# Patient Record
Sex: Female | Born: 1975 | Race: White | Hispanic: No | Marital: Married | Smoking: Current every day smoker
Health system: Southern US, Community
[De-identification: ages and names within clinical notes are randomized; demographics above are authoritative.]

## PROBLEM LIST (undated history)

## (undated) HISTORY — PX: ABDOMINAL HYSTERECTOMY: SHX81

---

## 1999-05-14 ENCOUNTER — Other Ambulatory Visit: Admission: RE | Admit: 1999-05-14 | Discharge: 1999-05-14 | Payer: Self-pay | Admitting: Obstetrics and Gynecology

## 1999-08-27 ENCOUNTER — Encounter: Admission: RE | Admit: 1999-08-27 | Discharge: 1999-11-25 | Payer: Self-pay | Admitting: Obstetrics and Gynecology

## 1999-10-17 ENCOUNTER — Encounter (HOSPITAL_COMMUNITY): Admission: AD | Admit: 1999-10-17 | Discharge: 1999-11-28 | Payer: Self-pay | Admitting: Obstetrics and Gynecology

## 1999-11-26 ENCOUNTER — Inpatient Hospital Stay (HOSPITAL_COMMUNITY): Admission: AD | Admit: 1999-11-26 | Discharge: 1999-11-26 | Payer: Self-pay | Admitting: Obstetrics and Gynecology

## 1999-11-27 ENCOUNTER — Encounter (INDEPENDENT_AMBULATORY_CARE_PROVIDER_SITE_OTHER): Payer: Self-pay

## 1999-11-27 ENCOUNTER — Inpatient Hospital Stay (HOSPITAL_COMMUNITY): Admission: AD | Admit: 1999-11-27 | Discharge: 1999-11-30 | Payer: Self-pay | Admitting: Obstetrics and Gynecology

## 2000-01-25 ENCOUNTER — Other Ambulatory Visit: Admission: RE | Admit: 2000-01-25 | Discharge: 2000-01-25 | Payer: Self-pay | Admitting: Obstetrics and Gynecology

## 2001-03-23 ENCOUNTER — Emergency Department (HOSPITAL_COMMUNITY): Admission: EM | Admit: 2001-03-23 | Discharge: 2001-03-23 | Payer: Self-pay | Admitting: Emergency Medicine

## 2002-08-27 ENCOUNTER — Encounter: Admission: RE | Admit: 2002-08-27 | Discharge: 2002-08-27 | Payer: Self-pay | Admitting: Obstetrics and Gynecology

## 2002-08-27 ENCOUNTER — Encounter: Payer: Self-pay | Admitting: Obstetrics and Gynecology

## 2002-10-14 ENCOUNTER — Observation Stay (HOSPITAL_COMMUNITY): Admission: RE | Admit: 2002-10-14 | Discharge: 2002-10-15 | Payer: Self-pay | Admitting: Surgery

## 2002-10-14 ENCOUNTER — Encounter (INDEPENDENT_AMBULATORY_CARE_PROVIDER_SITE_OTHER): Payer: Self-pay | Admitting: Specialist

## 2004-01-17 ENCOUNTER — Ambulatory Visit (HOSPITAL_COMMUNITY): Admission: RE | Admit: 2004-01-17 | Discharge: 2004-01-17 | Payer: Self-pay | Admitting: Obstetrics and Gynecology

## 2004-04-02 ENCOUNTER — Emergency Department (HOSPITAL_COMMUNITY): Admission: EM | Admit: 2004-04-02 | Discharge: 2004-04-02 | Payer: Self-pay | Admitting: Emergency Medicine

## 2004-08-16 ENCOUNTER — Observation Stay (HOSPITAL_COMMUNITY): Admission: AD | Admit: 2004-08-16 | Discharge: 2004-08-17 | Payer: Self-pay | Admitting: *Deleted

## 2004-08-30 ENCOUNTER — Inpatient Hospital Stay (HOSPITAL_COMMUNITY): Admission: AD | Admit: 2004-08-30 | Discharge: 2004-08-31 | Payer: Self-pay | Admitting: Obstetrics & Gynecology

## 2004-09-01 ENCOUNTER — Observation Stay (HOSPITAL_COMMUNITY): Admission: AD | Admit: 2004-09-01 | Discharge: 2004-09-02 | Payer: Self-pay | Admitting: *Deleted

## 2004-09-02 ENCOUNTER — Encounter (INDEPENDENT_AMBULATORY_CARE_PROVIDER_SITE_OTHER): Payer: Self-pay | Admitting: *Deleted

## 2004-09-02 ENCOUNTER — Inpatient Hospital Stay (HOSPITAL_COMMUNITY): Admission: AD | Admit: 2004-09-02 | Discharge: 2004-09-05 | Payer: Self-pay | Admitting: *Deleted

## 2005-04-11 ENCOUNTER — Inpatient Hospital Stay (HOSPITAL_COMMUNITY): Admission: AD | Admit: 2005-04-11 | Discharge: 2005-04-11 | Payer: Self-pay | Admitting: Obstetrics and Gynecology

## 2005-08-21 ENCOUNTER — Inpatient Hospital Stay (HOSPITAL_COMMUNITY): Admission: AD | Admit: 2005-08-21 | Discharge: 2005-08-21 | Payer: Self-pay | Admitting: Obstetrics and Gynecology

## 2005-09-18 ENCOUNTER — Inpatient Hospital Stay (HOSPITAL_COMMUNITY): Admission: AD | Admit: 2005-09-18 | Discharge: 2005-09-18 | Payer: Self-pay | Admitting: *Deleted

## 2005-10-01 ENCOUNTER — Ambulatory Visit (HOSPITAL_COMMUNITY): Admission: RE | Admit: 2005-10-01 | Discharge: 2005-10-01 | Payer: Self-pay | Admitting: Obstetrics and Gynecology

## 2005-10-02 ENCOUNTER — Ambulatory Visit: Payer: Self-pay | Admitting: Gynecology

## 2005-10-02 ENCOUNTER — Inpatient Hospital Stay (HOSPITAL_COMMUNITY): Admission: AD | Admit: 2005-10-02 | Discharge: 2005-10-02 | Payer: Self-pay | Admitting: Obstetrics and Gynecology

## 2005-10-09 ENCOUNTER — Ambulatory Visit: Payer: Self-pay | Admitting: Gynecology

## 2005-10-14 ENCOUNTER — Ambulatory Visit: Payer: Self-pay | Admitting: *Deleted

## 2005-10-14 ENCOUNTER — Inpatient Hospital Stay (HOSPITAL_COMMUNITY): Admission: AD | Admit: 2005-10-14 | Discharge: 2005-10-15 | Payer: Self-pay | Admitting: *Deleted

## 2005-10-14 ENCOUNTER — Inpatient Hospital Stay (HOSPITAL_COMMUNITY): Admission: AD | Admit: 2005-10-14 | Discharge: 2005-10-14 | Payer: Self-pay | Admitting: *Deleted

## 2005-10-16 ENCOUNTER — Inpatient Hospital Stay (HOSPITAL_COMMUNITY): Admission: AD | Admit: 2005-10-16 | Discharge: 2005-10-16 | Payer: Self-pay | Admitting: Obstetrics and Gynecology

## 2005-10-29 ENCOUNTER — Ambulatory Visit: Payer: Self-pay | Admitting: Obstetrics and Gynecology

## 2005-11-07 ENCOUNTER — Encounter (INDEPENDENT_AMBULATORY_CARE_PROVIDER_SITE_OTHER): Payer: Self-pay | Admitting: *Deleted

## 2005-11-07 ENCOUNTER — Inpatient Hospital Stay (HOSPITAL_COMMUNITY): Admission: AD | Admit: 2005-11-07 | Discharge: 2005-11-10 | Payer: Self-pay | Admitting: Obstetrics and Gynecology

## 2008-01-05 ENCOUNTER — Ambulatory Visit (HOSPITAL_COMMUNITY): Admission: RE | Admit: 2008-01-05 | Discharge: 2008-01-05 | Payer: Self-pay | Admitting: Obstetrics and Gynecology

## 2009-02-21 ENCOUNTER — Ambulatory Visit: Payer: Self-pay | Admitting: Family Medicine

## 2009-02-21 DIAGNOSIS — F3289 Other specified depressive episodes: Secondary | ICD-10-CM | POA: Insufficient documentation

## 2009-02-21 DIAGNOSIS — F329 Major depressive disorder, single episode, unspecified: Secondary | ICD-10-CM | POA: Insufficient documentation

## 2009-02-21 DIAGNOSIS — M7750 Other enthesopathy of unspecified foot: Secondary | ICD-10-CM | POA: Insufficient documentation

## 2009-12-06 ENCOUNTER — Encounter (INDEPENDENT_AMBULATORY_CARE_PROVIDER_SITE_OTHER): Payer: Self-pay | Admitting: Obstetrics and Gynecology

## 2009-12-06 ENCOUNTER — Ambulatory Visit (HOSPITAL_COMMUNITY)
Admission: RE | Admit: 2009-12-06 | Discharge: 2009-12-07 | Payer: Self-pay | Source: Home / Self Care | Admitting: Obstetrics and Gynecology

## 2010-09-03 LAB — CBC
HCT: 36.6 % (ref 36.0–46.0)
HCT: 44.5 % (ref 36.0–46.0)
Hemoglobin: 12.6 g/dL (ref 12.0–15.0)
Hemoglobin: 14.4 g/dL (ref 12.0–15.0)
MCH: 31.6 pg (ref 26.0–34.0)
MCHC: 32.3 g/dL (ref 30.0–36.0)
MCHC: 34.4 g/dL (ref 30.0–36.0)
MCV: 91.8 fL (ref 78.0–100.0)
MCV: 92.6 fL (ref 78.0–100.0)
Platelets: 231 10*3/uL (ref 150–400)
Platelets: 248 10*3/uL (ref 150–400)
RBC: 3.98 MIL/uL (ref 3.87–5.11)
RBC: 4.81 MIL/uL (ref 3.87–5.11)
RDW: 12 % (ref 11.5–15.5)
RDW: 12.3 % (ref 11.5–15.5)
WBC: 12.2 10*3/uL — ABNORMAL HIGH (ref 4.0–10.5)
WBC: 5.6 10*3/uL (ref 4.0–10.5)

## 2010-09-03 LAB — BASIC METABOLIC PANEL
BUN: 4 mg/dL — ABNORMAL LOW (ref 6–23)
CO2: 27 mEq/L (ref 19–32)
Calcium: 8.7 mg/dL (ref 8.4–10.5)
Chloride: 106 mEq/L (ref 96–112)
Creatinine, Ser: 0.46 mg/dL (ref 0.4–1.2)
GFR calc Af Amer: >60 mL/min (ref >60)
GFR calc non Af Amer: >60 mL/min (ref >60)
Glucose, Bld: 127 mg/dL — ABNORMAL HIGH (ref 70–99)
Potassium: 4 mEq/L (ref 3.5–5.1)
Sodium: 136 mEq/L (ref 135–145)

## 2010-09-03 LAB — SURGICAL PCR SCREEN
MRSA, PCR: NEGATIVE
Staphylococcus aureus: NEGATIVE

## 2010-10-30 NOTE — Op Note (Signed)
NAMEALIZ, MERITT NO.:  [id number]   MEDICAL RECORD NO.:  [id number]          PATIENT TYPE:  AMB   LOCATION:  SDC                           [facility]  [facility]   PHYSICIAN:  Maxie Better, M.D.DATE OF BIRTH:  1975-12-08   DATE OF PROCEDURE:  01/05/2008  DATE OF DISCHARGE:                               OPERATIVE REPORT   PREOPERATIVE DIAGNOSIS:  Menorrhagia.   POSTOPERATIVE DIAGNOSIS:  Menorrhagia.   PROCEDURE:  Diagnostic hysteroscopy with hydrothermal ablation of the  endometrium.   ANESTHESIA:  General paracervical block.   SURGEON:  Maxie Better, MD.   PROCEDURE:  Under adequate general anesthesia, the patient was placed in  the dorsal lithotomy. position.  Examination under anesthesia revealed  an anteverted normal-size uterus, no adnexal masses could be  appreciated.  The patient was sterilely prepped and draped in usual  fashion.  Bladder was catheterized for moderate amount of urine.  Bivalve speculum was placed in the vagina.  A 10 mL of 1% Nesacaine was  injected paracervically at 3 o'clock and 9 o'clock positions.  The  anterior lip of the cervix was grasped with a single-tooth tenaculum.  Cervix was inferiorly dilated up to #21 Pratt dilator and the  hydrothermal ablation apparatus was attempted to be introduced into the  uterine cavity. It was not able to traverse the external os and  therefore the cervix was dilated up to #23 [facility] dilator, which time the  hydrothermal ablation apparatus was inserted.  Inspection of the uterus  was noted for both tubal ostia could be seen.  There was a slight  arcuate presence of the fundus with some small polypoid lesion noted in  the uterine segment.  After ascertaining and positioning the apparatus  above where the polypoid lesions noted, the procedure was then performed  in this fashion as outlined by the manufacturer of the instrument with  no evidence of thermal reflux of the fluid at all.  When  the procedure  was felt to have been completed, all instruments were then removed from  the vagina.  Specimen was none.  Estimated blood loss was minimal.  Complication was none.  The patient tolerated the procedure well and was  transferred to the recovery room in stable condition.       Maxie Better, M.D.  Electronically Signed     [place]/MEDQ  D:  01/05/2008  T:  01/06/2008  Job:  [id number]

## 2010-11-02 NOTE — Op Note (Signed)
[name], [name] NO.:  [id number]   MEDICAL RECORD NO.:  [id number]          PATIENT TYPE:  INP   LOCATION:                                [facility]   PHYSICIAN:  Maxie Better, M.D.DATE OF BIRTH:  11-Oct-1975   DATE OF PROCEDURE:  11/07/2005  DATE OF DISCHARGE:                                 OPERATIVE REPORT   PREOPERATIVE DIAGNOSIS:  Previous cesarean section x2, twin gestation at 4  weeks.  Desires sterilization.   PROCEDURE:  Repeat cesarean section, [name] hysterotomy and modified Pomeroy  tubal ligation.   POSTOPERATIVE DIAGNOSIS:  Previous cesarean section x2; vertex /breech twin  gestation at 40 weeks [place]  Desires sterilization.   ANESTHESIA:  Spinal.   SURGEON:  Maxie Better, M.D.   ASSISTANTRichardean Sale, M.D.   PROCEDURE:  Under adequate spinal anesthesia, the patient was placed in the  supine position with a left lateral tilt.  She was sterilely prepped and  draped in the usual fashion.  An indwelling Foley catheter was sterilely  placed and 1% Marcaine was injected along the previous Pfannenstiel skin  incision.  Pfannenstiel skin incision was then made and carried down to the  rectus fascia.  Rectus fascia was opened transversely.  The rectus fascia  was carefully divided with sharp and blunt dissection inferiorly,  superiorly.  In trying to do the dissection the parietoperitoneum was  inadvertently opened.  This allowed for better exposure and ability to carry  the dissection of the rectus fascia off the rectus muscle superiorly; and  this was done.  The parietoperitoneum was opened further, but on entering  the abdominal cavity the bladder was adhered to the lower uterine segment.  This was carefully dissected off the lower uterine segment.  The uterine  segment and then shown to be very thin.  The transverse uterine incision was  then made in the lower uterine segment and extended bilaterally.  Artificial  rupture of membranes was done.  Clear copious amniotic fluid was noted.  Subsequent delivery of a live female from vertex position, maternal right,  was accomplished.  The baby was bulb suctioned, the abdomen cord was clamped  and cut.  The baby was transferred to the awaiting pediatrician.   The second twin was in a breech presentation, with the amniotic bag still  intact.  The feet were grasped.  Artificial rupture was then performed, and  the baby was delivered from a breech position with the usual maneuvers.  It  was a live female.  The baby was bulb suctioned, the abdomen cord was clamped  and cut.  The baby was transferred to the awaiting pediatrician.   Apgars of Twin A was 8 and 9; Twin B was 8 and 9.  Weight the baby A was 5  pounds 10 ounces.  Baby B was 6 pounds 5 ounces.  The placenta was manually  removed x2.  The uterine cavity was cleaned of debris.  The uterine incision  was then closed with 0 Monocryl running locked stitch,one layer closure with  care not to involve  the bladder.  There was a bleeding vessel on the  bladder, which was clamped, free tied and suture ligated for good  hemostasis.   Attention was then turned to the tubes bilaterally.  They were identified  down to their fimbriated ends.  Both ovaries were normal.  The mid portion  of both fallopian tubes were grasped with a Babcock.  The underlying  mesosalpinx was then opened.  Then 0 chromic suture x2 proximally and  distally used on both sides of the Babcock (on each tube) was then  performed.  The intervening segment of fallopian tubes on both sides were  then removed.   The abdomen was copiously irrigated, suctioned and good hemostasis then  noted.  The parietoperitoneum was not closed.  The rectus fascia was then  closed with 0 Vicryl x2.  The subcutaneous area was irrigated and small  bleeders cauterized.  Interrupted 2-0 plain suture was then placed.  The  skin was approximated using Ethibond  staples.   SPECIMEN:  Placentax 2, portion of the right and left fallopian tube were  sent to Pathology   ESTIMATED BLOOD LOSS:  750 cc.   INTRAOPERATIVE FLUID:  3200 cc crystalloid.   URINE OUTPUT:  150 cc yellow urine.   COUNTS:  Sponge and instrument counts x2 were correct.   COMPLICATION:  None.   DISPOSITION:  The patient tolerated the procedure well and was transferred  to recovery room in stable condition.      Maxie Better, M.D.  Electronically Signed     [place]/MEDQ  D:  11/07/2005  T:  11/07/2005  Job:  [id number]

## 2010-11-02 NOTE — Op Note (Signed)
NAMEJA, PISTOLE NO.:  [id number]   MEDICAL RECORD NO.:  [id number]          PATIENT TYPE:  INP   LOCATION:  9107                          [facility]  [facility]   PHYSICIAN:  [place] B. Earlene Plater, M.D.  DATE OF BIRTH:  1976-01-25   DATE OF PROCEDURE:  09/02/2004  DATE OF DISCHARGE:                                 OPERATIVE REPORT   PREOPERATIVE DIAGNOSIS:  Term intrauterine pregnancy.  Previous cesarean  section.  Repetitive severe variable decelerations.   POSTOPERATIVE DIAGNOSIS:  Term intrauterine pregnancy.  Previous cesarean  section.  Repetitive severe variable decelerations.   PROCEDURE:  Stat repeat low transverse cesarean section.   SURGEON:  Chester Holstein. Earlene Plater, M.D.   ANESTHESIA:  Spinal.   FINDINGS:  Viable female, 8 and 9 Apgars, arterial pH 7.35, no nuchal cord,  no uterine scar dehiscence.   SPECIMENS:  Placenta to pathology.   ESTIMATED BLOOD LOSS:  750 mL.   COMPLICATIONS:  None.   INDICATIONS FOR PROCEDURE:  The patient presented to labor and delivery in  active labor after having been discharged to home last night not in labor.  She stated she was actively laboring at home for several hours prior to her  return to the hospital as she did not want to be sent back home again.  Once  she was placed on the monitor, she was found to have fetal heart rate in the  120's with severe variable decelerations.  Amniotomy and scalp electrode  were placed and she immediately progressed to 9.  There was good recovery  after the decelerations and good short term variability noted.  The cervix  remained an anterior lip for several contractions and could not be reduced.  In addition, the deceleration pattern persisted.  I therefore recommended a  repeat cesarean section given the possibility for uterine scar dehiscence  given her previous cesarean section.   DESCRIPTION OF PROCEDURE:  The patient was taken to the operating room and  fetal heart rate noted to  be steady in the 130's.  Therefore, the patient  was deemed a good candidate for spinal.  This went very quickly and the  patient was prepped and draped in the usual sterile fashion.  Incision made  through the previous scar and carried sharply to the fascia.  The fascia was  dissected sharply.  The underlying rectus muscles were dissected off  sharply.   The posterior sheath and peritoneum were entered sharply and extended  inferiorly sharply.  Bladder blade inserted. Bladder flap created sharply.  Uterine incision made in a low transverse fashion with the knife.  Of note,  the scar was intact.  Meconium stained fluid was noted at amniotomy.  The  infant's head was delivered through the incision without difficulty and the  nose and mouth suctioned with the DeLee and the remainder of the infant  delivered without difficulty.  There was no nuchal cord or any other obvious  explanation for the decelerations.  The infant was immediately vigorous and  handed off to the awaiting pediatricians.  1 gram of Ancef was given  at cord  clamp.  The placenta was removed manually and the uterus exteriorized and  cleared of all clots and debris.  It was free of extension.  The uterine  incision was then closed in a running locking stitch of 0 Monocryl.  A  second imbricating layer placed with the same stitch.  Hemostasis was  obtained.   The uterus was returned to the abdomen and was irrigated.  Hemostasis noted  at the uterine incision, bladder flap, and subfascial space.  The fascia was  closed with a running stitch of 0 Vicryl.  The subcutaneous tissue was  irrigated and made hemostatic with the Bovie.  Skin was closed with staples.   The patient tolerated the procedure well and there were no complications.  She was taken to the recovery room awake, alert, and in stable condition.  All needle, sponge, and instrument counts correct per the operating room  staff.      WBD/MEDQ  D:  09/02/2004  T:   09/03/2004  Job:  [id number]

## 2010-11-02 NOTE — H&P (Signed)
[facility]  Patient:    SRESHTA, CRESSLER                          MRN: [id number] Adm. Date:  [id number] Disc. Date: [id number] Attending:  Maxie Better                         History and Physical  CHIEF COMPLAINT:              Active labor.  HISTORY OF PRESENT ILLNESS:   A 35 year old white female, gravida 1, para 0, EDD December 13, 1999, at 37+ weeks in active labor.  PAST MEDICAL HISTORY:         Remarkable for no pregnancies.  ALLERGIES:                    No known drug allergies.  MEDICATIONS:                  Prenatal vitamins.  No medical or surgical hospitalizations.  FAMILY HISTORY:               Hypertension, strokes, and brain cancer.  SOCIAL HISTORY:               She is a nonsmoker, nondrinker, and denies domestic or physical violence.  Pregnancy is consistent with dichorionic, diamniotic gestation without complications except for some interval oligohydramnios which as resolved.  PHYSICAL EXAMINATION:  GENERAL:                      She is a well-developed, well-nourished white female in no acute distress.  HEENT:                        Normal.  LUNGS:                        Clear.  HEART:                        Regular rate and rhythm.  ABDOMEN:                      Soft, gravid, and nontender.  Estimated fetal weight of 6-1/2 pounds and 5-1/2 to 6 pounds by previous ultrasounds.  PELVIC:                       Cervix is 4 to 5 cm, 80%, and vertex at -2.  EXTREMITIES:                  Revealed no cords.  NEUROLOGICAL:                 Nonfocal.  IMPRESSION:                   1. 37-week intrauterine pregnancy.                               2. Twin dichorionic, diamniotic gestation.                               3. Active labor.  4. History of PUPPS status post steroid taper.  PLAN:                         Admit to [facility] [place], amniotomy or monitor twin A internally.   Will scan for presentation and determine mode of delivery pending presentation. DD:  11/27/99 TD:  11/30/99 Job: [id number]

## 2010-11-02 NOTE — Discharge Summary (Signed)
[facility]  Patient:    [name], [name]                          MRN: [id number] Adm. Date:  [id number] Disc. Date: [id number] Attending:  Lenoard Aden                           Discharge Summary  HOSPITAL COURSE:              The patient was admitted with twin intrauterine pregnancy in active labor, vertex breech.  During her labor pattern, she decided that she was uncomfortable with possible breech vaginal delivery.  I told her that there was a 50% success rate of external cephalic version. Therefore, the decision was made to proceed with primary LTCS, which she did for healthy female and female twins.  Her postoperative course was uncomplicated.  She was discharged to home on day #3.  Prenatal vitamins with iron and Tylox were given.  She was to follow up in the office in 4-6 weeks. Staple removal was performed.  Discharge teaching was done. DD:  12/20/99 TD:  12/21/99 Job: [id number]

## 2010-11-02 NOTE — Op Note (Signed)
NAMEMELISIA, LEMING NO.:  [id number]   MEDICAL RECORD NO.:  [id number]                   PATIENT TYPE:  AMB   LOCATION:  DAY                                  FACILITY:  [facility]   PHYSICIAN:  Currie Paris, M.D.           DATE OF BIRTH:  July 23, 1975   DATE OF PROCEDURE:  10/14/2002  DATE OF DISCHARGE:                                 OPERATIVE REPORT   PREOPERATIVE DIAGNOSIS:  Chronic right lower quadrant pain.  Possible  appendiceal abnormality.   POSTOPERATIVE DIAGNOSIS:  Chronic right lower quadrant pain.  Possible  appendiceal abnormality.   OPERATION:  Diagnostic laparoscopy (by Dr. Cherly Hensen).  Laparoscopic  appendectomy.   SURGEON:  Currie Paris, M.D.   ANESTHESIA:  General.   CLINICAL HISTORY:  This patient is a 35 year old woman with chronic right  lower quadrant pain.  At a prior laparoscopy in [facility], there had been a  twisting of the appendix noted, and the appendix had been untwisted.  She had been having persistent right lower quadrant symptoms, and it was not  clear whether some pelvic cause or whether she was having some chronic  appendiceal symptoms.  GI consultation was obtained.  No further workup was  recommended, and we elected to perform a laparoscopy with plans made for a  diagnostic laparoscopy to rule out pelvic disease, as well as laparoscopic  appendectomy.   DESCRIPTION OF PROCEDURE:  The patient was seen in the holding area.  She  had no further questions.  She was taken to the operating room, and after  satisfactory general anesthesia had been obtained, the abdomen and pelvic  areas were prepped and draped.  Then 0.25% Marcaine was placed in the  umbilicus and a short incision made, starting at the umbilicus and going a  little bit inferiorly.  However, as we got to the fascia, we noticed that  there was a small umbilical hernia at the top end of the incision.  This  contained ________, and I went  ahead and extended the incision superiorly  into the umbilicus, freed the skin off the fascia, and simply used the  umbilical hernia as the site of the trocar placement.  We tried to put a  pursestring in so we could close it, and it would just barely admit a 10 mm  trocar.  Once the trocar was in, the abdomen was insufflated to 15.  The  patient was placed in Trendelenburg.  I went ahead at this point and placed  a 5 mm trocar in the right upper quadrant and a 10-11 in the left lower  quadrant, both under direct vision.  On inspection initially, we did see  some omental adhesions to the lower midline, and I took these down with the  harmonic scalpel.  There was a question of some peritoneal defect here in  the anterior abdominal wall, but there appeared  to be muscle under it, and  despite looking at this point and looking at the end of the case, I could  not discern that this was a definitive hernia.  Palpation with the abdomen  distended really could not insufflate a sac or see that this was a hernia.  It appeared to be just an area where the omentum had been stuck up.   At this point, Dr. Cherly Hensen performed her diagnostic laparoscopy with the  descriptions dictated separately.  She did perform a small biopsy.  Once she  had completed that, I put the camera in the left lower quadrant port and  found the appendix, which was lying somewhat curvilinearly and a little bit  lateral to the cecum.  However, once I was able to grab it, it was fairly  mobile and fairly long.  I made a window in the mesoappendix near the base  and divided the mesoappendix with the harmonic scalpel until I got down to  the base.  The appendix was divided with the endo GIA blue stapler.  It was  placed in a bag and removed.  The abdomen was reinsufflated, and we found a  couple of spare staples that we pulled out to make sure that we did not  leave any of those behind.  There was no evidence of bleeding.  Final  check  for hemostasis was made, checking the biopsy site of the pelvis and looking  at the area of the anterior abdominal wall.  The right upper quadrant and  left upper quadrant trocars were removed without difficulty.  The _________  through the epigastric port, and a pursestring was tied down.  The skin was  closed with 4-0 Monocryl subcuticular plus Steri-Strips.  The patient  tolerated the procedure well.  There were no operative complications, and  all counts were correct.                                               Currie Paris, M.D.    CJS/MEDQ  D:  10/14/2002  T:  10/14/2002  Job:  [id number]   cc:   Maxie Better, M.D.  [address]  [address]  [place]  [place] [address]  Fax: [phone]

## 2010-11-02 NOTE — Discharge Summary (Signed)
[name], [name] NO.:  [id number]   MEDICAL RECORD NO.:  [id number]          PATIENT TYPE:  INP   LOCATION:                                [facility]   PHYSICIAN:  Maxie Better, M.D.DATE OF BIRTH:  July 10, 1975   DATE OF ADMISSION:  11/07/2005  DATE OF DISCHARGE:  11/10/2005                                 DISCHARGE SUMMARY   ADMISSION DIAGNOSES:  Previous cesarean section, desires sterilization, twin  gestation, intrauterine gestation at 54 weeks.   DISCHARGE DIAGNOSES:  Twin gestation delivered, previous cesarean section  x2, desires sterilization.   PROCEDURE:  Repeat cesarean section, modified Pomeroy tubal ligation.   HISTORY OF PRESENT ILLNESS:  A 35 year old G37, P2-0-0-3 female with previous  cesarean section x2 who is now at 16 weeks with vertex breech twin  gestation, and now desiring delivery.  The patient also requests permanent  sterilization.   HOSPITAL COURSE:  The patient was admitted to [facility]  She was  taken to the operating room where she underwent a repeat cesarean section, a  modified Pomeroy tubal ligation.  The procedure resulted in the delivery of  a live female, 5 pounds 10 ounces, Apgar's of 8 and 9, a live female 6 pounds  5 ounces, Apgar's of 8 and 9 as well.  Postoperatively, the patient did  well.  She had an episode of migraine for which Fioricet was utilized for  management.  Her CBC on postop day #1 showed a hemoglobin of 10.4,  hematocrit of 30.9.  She was on a regular diet.  By postop day #3, the  patient had a bowel movement.  The incision was without erythema or  induration.  She was deemed well to be discharged home.   DISPOSITION:  Home.   CONDITION ON DISCHARGE:  Stable.   DISCHARGE MEDICATIONS:  1.  Fiorinal 1 every 4 hours p.r.n. headache.  2.  Tylox 1 to 2 tablets every 3 to 4 hours p.r.n. pain.  3.  Motrin 800 mg 1 p.o. q.6 h. p.r.n.  pain.  4.  Over-the-counter iron supplementation 1 p.o.  b.i.d.  5.  Prenatal vitamins 1 p.o. daily.   FOLLOWUP:  Followup appointment at [facility] in 6 weeks.   DISCHARGE INSTRUCTIONS:  Postpartum booklet given.      Maxie Better, M.D.  Electronically Signed     [place]/MEDQ  D:  12/14/2005  T:  12/14/2005  Job:  [id number]

## 2010-11-02 NOTE — Discharge Summary (Signed)
NAMEEUFEMIA, PRINDLE NO.:  [id number]   MEDICAL RECORD NO.:  [id number]          PATIENT TYPE:  INP   LOCATION:  9153                          [facility]ILITY[facility]  [facility]   PHYSICIAN:  [place] B. Earlene Plater, M.D.  DATE OF BIRTH:  1975/12/03   DATE OF ADMISSION:  08/16/2004  DATE OF DISCHARGE:  08/17/2004                                 DISCHARGE SUMMARY   ADMITTING DIAGNOSES:  1.  Thirty-seven-week intrauterine pregnancy.  2.  Status post fall.   DISCHARGE DIAGNOSES:  1.  Thirty-seven-week intrauterine pregnancy.  2.  Status post fall.   HISTORY OF PRESENT ILLNESS:  A 35 year old white female gravida 2 para 1 at  37+ weeks who fell in a parking lot to her knee and shoulder. Was found to  have contractions about every 7-8 minutes in maternity admissions and was  therefore admitted for 23-hour observation.   HOSPITAL COURSE:  The patient was placed on observation for 24 hours.  Ultrasound and Kleihauer-Betke were within normal limits. The patient did  not demonstrate significant persistent contractions over the 24-hour period,  did not demonstrate any cervical change, and was therefore discharged home  the following day.   DISCHARGE INSTRUCTIONS:  Labor precautions and bleeding precautions were  given.   FOLLOW-UP:  [facility] within 1 week.   DISPOSITION AT DISCHARGE:  Improved.   DISCHARGE MEDICATIONS:  Prenatal vitamins.      WBD/MEDQ  D:  09/06/2004  T:  09/06/2004  Job:  [id number]

## 2010-11-02 NOTE — Discharge Summary (Signed)
[name], [name] NO.:  [id number]   MEDICAL RECORD NO.:  [id number]          PATIENT TYPE:  INP   LOCATION:  9107                          [facility]   PHYSICIAN:  [place] B. Earlene Plater, M.D.  DATE OF BIRTH:  07-Jan-1976   DATE OF ADMISSION:  09/02/2004  DATE OF DISCHARGE:  09/05/2004                                 DISCHARGE SUMMARY   ADMISSION DIAGNOSES:  1.  Term intrauterine pregnancy.  2.  Spontaneous labor.  3.  Repetitive severe variable decelerations.  4.  Previous cesarean section.   HISTORY OF PRESENT ILLNESS:  See H&P for complete details.  The patient  presented at term with a previous C-section in active labor at 7 cm and  found to have severe repetitive variable decelerations.  This was  accompanied by meconium-stained fluid and a persistent pattern of  decelerations.  Therefore, recommended proceeding directly to emergency C-  section.   The patient was subsequently delivered by a stat repeat low transverse  cesarean section.  Findings included a viable female.  Apgars were 8 and 9.  Arterial pH was 7.35.  There was no evidence of a nuchal cord or scar  dehiscence.   Postoperatively, the patient rapidly regained her ability to ambulate, void,  and tolerate a regular diet.  She is discharged home on the third  postoperative day in satisfactory condition.   DISCHARGE INSTRUCTIONS:  Standard preprinted instructions are given prior to  dismissal.   DISCHARGE MEDICATIONS:  Tylox 1-2 q.4-6h. p.r.n. pain.   DISPOSITION:  Discharged in satisfactory condition.   FOLLOW-UP:  [facility] 1 month.      WBD/MEDQ  D:  09/27/2004  T:  09/27/2004  Job:  [id number]

## 2010-11-02 NOTE — Op Note (Signed)
NAMEELESE, RANE NO.:  [id number]   MEDICAL RECORD NO.:  [id number]                   PATIENT TYPE:  OBV   LOCATION:  0483                                 FACILITY:  [facility]   PHYSICIAN:  Maxie Better, M.D.            DATE OF BIRTH:  09-28-1975   DATE OF PROCEDURE:  10/14/2002  DATE OF DISCHARGE:                                 OPERATIVE REPORT   PREOPERATIVE DIAGNOSIS:  Chronic right lower quadrant pain.   POSTOPERATIVE DIAGNOSIS:  Pelvic adhesions, right lower quadrant pain,  question pelvic endometriosis.   PROCEDURE:  Open laparoscopy peritoneal biopsy ([place]), laparoscopic  appendectomy by Dr. Cyndia Bent as well as umbilical hernia repair and  lysis of adhesions by Dr. Jamey Ripa.   ANESTHESIA:  General.   SURGEON:  1. Currie Paris, M.D. for the above surgery.  2. Maxie Better, M.D. for the GYN portion.   DESCRIPTION OF PROCEDURE:  Under adequate general anesthesia, the patient  was placed in the dorsal lithotomy position. Examination under anesthesia  revealed anteverted uterus mobile, no adnexal masses could be appreciated.  The patient was sterilely prepped and draped in the usual fashion, a bivalve  speculum was placed in the vagina, single tooth tenaculum was placed in the  anterior lip of the cervix and acorn cannula was introduced in the cervical  os and attached to the tenaculum for manipulation of the uterus. The bivalve  speculum was removed and indwelling Foley catheter was placed sterilely. The  procedure was begun by Dr. Jamey Ripa who did the open laparoscopic incision  infraumbilically, please see his dictated note for the details. There was  three placements, one of incision, one in the left lower quadrant, and one  in the right superior and lateral quadrant. Through his umbilical ports, the  pelvis was inspected and findings were that of anteverted uterus with  possible anterior portion of the uterus  being adherent to the anterior  abdominal wall; however, the line of demarcation was not that clear. The  right tube and ovaries were normal, left tube and ovary was normal. The left  ovary was showing evidence of ruptured corpus gluteal cyst. There was some  blood in the posterior cul-de-sac probably secondary to the rupture of the  corpus luteum. In addition, there were omental adhesions adherent to the  anterior abdominal wall slightly to the left at the midline. This was lysed  using the harmonic scalpel by Dr. Jamey Ripa. The pelvis was irrigated and  suctioned. The posterior cul-de-sac was notable for some bleeding right  implants suggestive of possible endometriosis. A peritoneal biopsy was  obtained, the site of the biopsy was cauterized and the remaining aspect of  the pelvis was left untouched by me. The rest of the procedure including the  closure was done by Dr. Jamey Ripa. The instruments in the vagina was removed by  me prior to leaving the operating  room. Again I direct you to Dr. Tenna Child  operative report for his more specific details. Estimated blood loss was  minimal. Complications was none. The patient tolerated the procedure well  and was transferred to the recovery room in stable condition.   SPECIMENS:  Peritoneal biopsy from the posterior cul-de-sac and the appendix  that was removed by Dr. Jamey Ripa.                                               Maxie Better, M.D.   [place]/MEDQ  D:  10/14/2002  T:  10/15/2002  Job:  [id number]

## 2010-11-02 NOTE — H&P (Signed)
NAMEMICHAELLE, [name] NO.:  [id number]   MEDICAL RECORD NO.:  [id number]          PATIENT TYPE:  INP   LOCATION:  9107                          FACILITY:  [facility]   PHYSICIAN:  [place] B. Earlene Plater, M.D.  DATE OF BIRTH:  06-06-1976   DATE OF ADMISSION:  09/02/2004  DATE OF DISCHARGE:                                HISTORY & PHYSICAL   ADMISSION HISTORY AND PHYSICAL   ADMISSION DIAGNOSES:  1.  Term intrauterine pregnancy.  2.  Spontaneous labor.  3.  Repetitive severe variable decelerations.  4.  Previous cesarean section.   HISTORY OF PRESENT ILLNESS:  A 35 year old white female, gravida 2, para 1,  40 weeks presented to labor and delivery in active labor, 7 cm, with severe  repetitive variable decelerations.  Amniotomy was performed, and the patient  immediately progressed to 9 cm with meconium-stained fluid.  She had  persistent severe variable decelerations and was therefore taken for stat C-  section.  Therefore this H&P is being dictated immediately after her  emergency C-section.   PAST SURGICAL HISTORY:  Previous C-section for twins.  Past surgical history  otherwise negative.   PAST MEDICAL HISTORY:  Otherwise negative.   MEDICATIONS:  Prenatal vitamins.   ALLERGIES:  None.   PRENATAL LABORATORIES:  Group B strep was negative--for others see prenatal  chart.   REVIEW OF SYSTEMS:  Otherwise negative.   PHYSICAL EXAMINATION:  VITAL SIGNS:  Blood pressure in the 130s/90s range  during contractions and thought to be more likely due to pain.  Fetal heart  rate in the 120s with decelerations into the 60s to 90s range.  HEART:  Regular rate and rhythm.  LUNGS:  Clear to auscultation.  ABDOMEN:  Gravid, nontender.  No incisional tenderness at her previous C-  section site.  CERVICAL EXAM:  As outlined above.   ASSESSMENT:  Term intrauterine pregnancy.  Previous cesarean section with  repetitive severe variables.   PLAN:  Urgent repeat  C-section.      WBD/MEDQ  D:  09/02/2004  T:  09/02/2004  Job:  [id number]

## 2010-11-02 NOTE — Op Note (Signed)
[facility]  Patient:    [name], [name]                          MRN: [id number] Proc. Date: 11/27/99 Adm. Date:  [id number] Disc. Date: [id number] Attending:  Maxie Better                           Operative Report  PREOPERATIVE DIAGNOSIS:       Twin intrauterine pregnancy in active labor, vertex and breech presentation.  POSTOPERATIVE DIAGNOSIS:      Twin intrauterine pregnancy in active labor, vertex and breech presentation.  OPERATION:                    Primary low transverse cesarean section.  SURGEON:                      Lenoard Aden, M.D.  ASSISTANT:                    Sheronette A. Cherly Hensen, M.D.  ANESTHESIA:                   Epidural by Raul Del, M.D.  ESTIMATED BLOOD LOSS:         1000 cc.  COMPLICATIONS:                None.  DRAINS:                       Foley.  COUNTS:                       Correct.  FINDINGS:                     Fullterm living female, vertex position, Apgars 8 and 9. Footling breech presenting female, Apgars 8 and 9.  Two pediatricians in attendance.  Placenta manually intact to pathology.  DESCRIPTION OF PROCEDURE:     After being apprised of risks of anesthesia, infection, bleeding, injury to abdominal organs with need for repair, the patient is brought to the operating room where she is dosed an adequate dose of her epidural anesthetic and surgical anesthesia is achieved.  She is prepped and draped in the usual sterile fashion and Foley catheter placed.  After achieving adequate anesthesia, a Pfannenstiel skin incision was made with a scalpel and carried down to the fascia which is nicked in the midline and opened transversely using Mayo  scissors.  The rectus muscles were dissected sharply in the midline and peritoneum entered sharply.  The bladder flap developed in a smile-like fashion using Metzenbaum scissors and dissected off the lower uterine segment.  The uterus was scored  in a smile-like fashion.  Atraumatic entry.  Delivery of twin A from vertex position atraumatically.  Apgars 8 and 9, to the pediatricians in attendance. ord clamped.  Footling breech delivery of twin B which is delivered through the usual maneuvers and handed to the pediatricians who are in attendance.  After noticing clear fluid, the cord is clamped, the placenta is delivered manually intact. The uterus is exteriorized.  Normal tubes and ovaries noted.  The uterus is closed after being curetted with a dry lap pack using a 0 Monocryl in a continuous running fashion, a second imbricating layer placed.  Good hemostasis is achieved. Pericolic gutters irrigated.  All blood clots were subsequently removed.  At this time, the bladder flap was inspected.  The rectus muscles were inspected.  The fascia was closed using 0 Vicryl in a continuous running fashion.  The skin was  closed using staples.  Dilute Marcaine solution is placed.  The patient tolerates the procedure well and is transported to the recovery room in good condition. DD:  11/27/99 TD:  11/30/99 Job: [id number]

## 2011-03-15 LAB — CBC
HCT: 38.9
Hemoglobin: 13
MCHC: 33.5
MCV: 88.2
Platelets: 285
RBC: 4.41
RDW: 13.7
WBC: 8.6

## 2015-12-19 ENCOUNTER — Encounter: Payer: Self-pay | Admitting: *Deleted

## 2015-12-19 ENCOUNTER — Emergency Department (INDEPENDENT_AMBULATORY_CARE_PROVIDER_SITE_OTHER): Payer: BC Managed Care – PPO

## 2015-12-19 ENCOUNTER — Emergency Department
Admission: EM | Admit: 2015-12-19 | Discharge: 2015-12-19 | Disposition: A | Discharge status: Home / Self Care | Payer: BC Managed Care – PPO | Source: Home / Self Care | Attending: Family Medicine | Admitting: Family Medicine

## 2015-12-19 DIAGNOSIS — S93402A Sprain of unspecified ligament of left ankle, initial encounter: Secondary | ICD-10-CM | POA: Diagnosis not present

## 2015-12-19 DIAGNOSIS — M25472 Effusion, left ankle: Secondary | ICD-10-CM | POA: Diagnosis not present

## 2015-12-19 MED ORDER — HYDROCODONE-ACETAMINOPHEN 5-325 MG PO TABS: 1.0000 | ORAL_TABLET | Freq: Four times a day (QID) | ORAL | Status: AC | PRN

## 2015-12-19 NOTE — ED Notes (Signed)
Pt c/o LT ankle/foot pain x 1600 today after "rolling it" while getting in the pool. Took IBF 800mg  at 1600.

## 2015-12-19 NOTE — Discharge Instructions (Signed)
Apply ice pack for 30 minutes every 1 to 2 hours today and tomorrow.  Elevate.  Use crutches for 3 to 5 days.  Wear Ace wrap until swelling decreases.  Wear brace for about 2 to 3 weeks.  Begin range of motion and stretching exercises in about 5 days as per instruction sheet.  May take Ibuprofen 200mg, 4 tabs every 8 hours with food.  ° ° °Ankle Sprain °An ankle sprain is an injury to the strong, fibrous tissues (ligaments) that hold the bones of your ankle joint together.  °CAUSES °An ankle sprain is usually caused by a fall or by twisting your ankle. Ankle sprains most commonly occur when you step on the outer edge of your foot, and your ankle turns inward. People who participate in sports are more prone to these types of injuries.  °SYMPTOMS  °· Pain in your ankle. The pain may be present at rest or only when you are trying to stand or walk. °· Swelling. °· Bruising. Bruising may develop immediately or within 1 to 2 days after your injury. °· Difficulty standing or walking, particularly when turning corners or changing directions. °DIAGNOSIS  °Your caregiver will ask you details about your injury and perform a physical exam of your ankle to determine if you have an ankle sprain. During the physical exam, your caregiver will press on and apply pressure to specific areas of your foot and ankle. Your caregiver will try to move your ankle in certain ways. An X-ray exam may be done to be sure a bone was not broken or a ligament did not separate from one of the bones in your ankle (avulsion fracture).  °TREATMENT  °Certain types of braces can help stabilize your ankle. Your caregiver can make a recommendation for this. Your caregiver may recommend the use of medicine for pain. If your sprain is severe, your caregiver may refer you to a surgeon who helps to restore function to parts of your skeletal system (orthopedist) or a physical therapist. °HOME CARE INSTRUCTIONS  °· Apply ice to your injury for 1-2 days or as  directed by your caregiver. Applying ice helps to reduce inflammation and pain. °¨ Put ice in a plastic bag. °¨ Place a towel between your skin and the bag. °¨ Leave the ice on for 15-20 minutes at a time, every 2 hours while you are awake. °· Only take over-the-counter or prescription medicines for pain, discomfort, or fever as directed by your caregiver. °· Elevate your injured ankle above the level of your heart as much as possible for 2-3 days. °· If your caregiver recommends crutches, use them as instructed. Gradually put weight on the affected ankle. Continue to use crutches or a cane until you can walk without feeling pain in your ankle. °· If you have a plaster splint, wear the splint as directed by your caregiver. Do not rest it on anything harder than a pillow for the first 24 hours. Do not put weight on it. Do not get it wet. You may take it off to take a shower or bath. °· You may have been given an elastic bandage to wear around your ankle to provide support. If the elastic bandage is too tight (you have numbness or tingling in your foot or your foot becomes cold and blue), adjust the bandage to make it comfortable. °· If you have an air splint, you may blow more air into it or let air out to make it more comfortable. You may take   your splint off at night and before taking a shower or bath. Wiggle your toes in the splint several times per day to decrease swelling. °SEEK MEDICAL CARE IF:  °· You have rapidly increasing bruising or swelling. °· Your toes feel extremely cold or you lose feeling in your foot. °· Your pain is not relieved with medicine. °SEEK IMMEDIATE MEDICAL CARE IF: °· Your toes are numb or blue. °· You have severe pain that is increasing. °MAKE SURE YOU:  °· Understand these instructions. °· Will watch your condition. °· Will get help right away if you are not doing well or get worse. °  °This information is not intended to replace advice given to you by your health care provider. Make  sure you discuss any questions you have with your health care provider. °  °Document Released: 06/03/2005 Document Revised: 06/24/2014 Document Reviewed: 06/15/2011 °Elsevier Interactive Patient Education ©2016 [employer] ° °

## 2015-12-19 NOTE — ED Provider Notes (Signed)
CSN: [id number]     Arrival date & time 12/19/15  1702 History   First MD Initiated Contact with Patient 12/19/15 1736     Chief Complaint  Patient presents with  . Ankle Pain      HPI Comments: While at a pool today, patient twisted her left ankle/foot, followed by immediate pain/swelling.  Patient is a 40 y.o. female presenting with ankle pain. The history is provided by the patient.  Ankle Pain Location:  Ankle Time since incident:  3 hours Injury: yes   Mechanism of injury comment:  Twisted foot/ankle Ankle location:  L ankle Pain details:    Quality:  Aching   Radiates to:  Does not radiate   Severity:  Severe   Onset quality:  Sudden   Duration:  3 hours   Timing:  Constant   Progression:  Unchanged Chronicity:  New Dislocation: no   Prior injury to area:  Yes Relieved by:  Nothing Worsened by:  Bearing weight Ineffective treatments:  Ice and NSAIDs Associated symptoms: decreased ROM, stiffness and swelling   Associated symptoms: no muscle weakness, no numbness and no tingling     History reviewed. No pertinent past medical history. Past Surgical History  Procedure Laterality Date  . Cesarean section    . Abdominal hysterectomy     History reviewed. No pertinent family history. Social History  Substance Use Topics  . Smoking status: Current Every Day Smoker -- 1.00 packs/day    Types: Cigarettes  . Smokeless tobacco: None  . Alcohol Use: No   OB History    No data available     Review of Systems  Musculoskeletal: Positive for stiffness.    Allergies  Review of patient's allergies indicates no known allergies.  Home Medications   Prior to Admission medications   Medication Sig Start Date End Date Taking? Authorizing Provider  escitalopram (LEXAPRO) 10 MG tablet Take 10 mg by mouth daily.   Yes Historical Provider, MD  HYDROcodone-acetaminophen (NORCO/VICODIN) 5-325 MG tablet Take 1 tablet by mouth every 6 (six) hours as needed for moderate pain.  12/19/15   Lattie HawStephen A Beese, MD   Meds Ordered and Administered this Visit  Medications - No data to display  BP 115/72 mmHg  Pulse 92  Temp(Src) 98.2 F (36.8 C) (Oral)  Resp 18  Ht 5\' 4"  (1.626 m)  Wt 127 lb (57.607 kg)  BMI 21.79 kg/m2  SpO2 98% No data found.   Physical Exam  Constitutional: She is oriented to person, place, and time. She appears well-developed and well-nourished. No distress.  HENT:  Head: Atraumatic.  Eyes: Conjunctivae are normal. Pupils are equal, round, and reactive to light.  Pulmonary/Chest: No respiratory distress.  Musculoskeletal:       Left ankle: She exhibits decreased range of motion and swelling. She exhibits no ecchymosis, no deformity, no laceration and normal pulse. Tenderness. Lateral malleolus, AITFL, CF ligament and posterior TFL tenderness found. No medial malleolus, no head of 5th metatarsal and no proximal fibula tenderness found. Achilles tendon normal.       Left foot: There is decreased range of motion and tenderness.       Feet:  Left ankle:  Decreased range of motion.  Tenderness and swelling over the lateral malleolus.  Joint stable.  No tenderness over the base of the fifth metatarsal.  Distal neurovascular function is intact.   Neurological: She is alert and oriented to person, place, and time.  Skin: Skin is warm and dry.  Nursing note and vitals reviewed.   ED Course  Procedures none  Imaging Review Dg Ankle Complete Left  12/19/2015  CLINICAL DATA:  Pain and swelling after going into swimming pool EXAM: LEFT ANKLE COMPLETE - 3+ VIEW COMPARISON:  None. FINDINGS: Frontal, oblique, and lateral views were obtained. There is rather marked soft tissue swelling laterally. There is an ankle joint effusion. There is no evident fracture. The ankle mortise appears intact. There is pes cavus. There is a bone island in the calcaneus. IMPRESSION: Soft tissue swelling laterally with joint effusion. Suspect ligamentous injury. No fracture  evident. Ankle mortise appears intact. Electronically Signed   By: Bretta BangWilliam  Woodruff III M.D.   On: 12/19/2015 17:54   Dg Foot Complete Left  12/19/2015  CLINICAL DATA:  Pain and swelling after climbing into swimming pool EXAM: LEFT FOOT - COMPLETE 3+ VIEW COMPARISON:  February 21, 2009 FINDINGS: Frontal, oblique, and lateral views were obtained. There is no demonstrable fracture or dislocation. The joint spaces appear normal. No erosive change or bony destruction. There is pes cavus. There is a small bone island in the calcaneus, stable. There is evidence of an ankle joint effusion. IMPRESSION: Ankle joint effusion noted. Pes cavus. No fracture or dislocation. No apparent arthropathy. Electronically Signed   By: Bretta BangWilliam  Woodruff III M.D.   On: 12/19/2015 17:52      MDM   1. Left ankle sprain, initial encounter     Dispensed AirCast stirrup splint.  Applied Ace wrap. Rx for Lortab. Apply ice pack for 30 minutes every 1 to 2 hours today and tomorrow.  Elevate.  Use crutches for 3 to 5 days.  Wear Ace wrap until swelling decreases.  Wear brace for about 2 to 3 weeks.  Begin range of motion and stretching exercises in about 5 days as per instruction sheet.  May take Ibuprofen 200mg , 4 tabs every 8 hours with food.  Followup with Dr. Rodney Langtonhomas Thekkekandam or Dr. Clementeen GrahamEvan Corey ([facility]) if not improving about two weeks.     Lattie HawStephen A Beese, MD 12/19/15 [phone]

## 2017-01-08 IMAGING — DX DG ANKLE COMPLETE 3+V*L*
3 series · 3 of 3 positions shown · non-contrast
Comparison: None.

CLINICAL DATA: Pain and swelling after going into swimming pool

EXAM:
LEFT ANKLE COMPLETE - 3+ VIEW

[ankle ap]
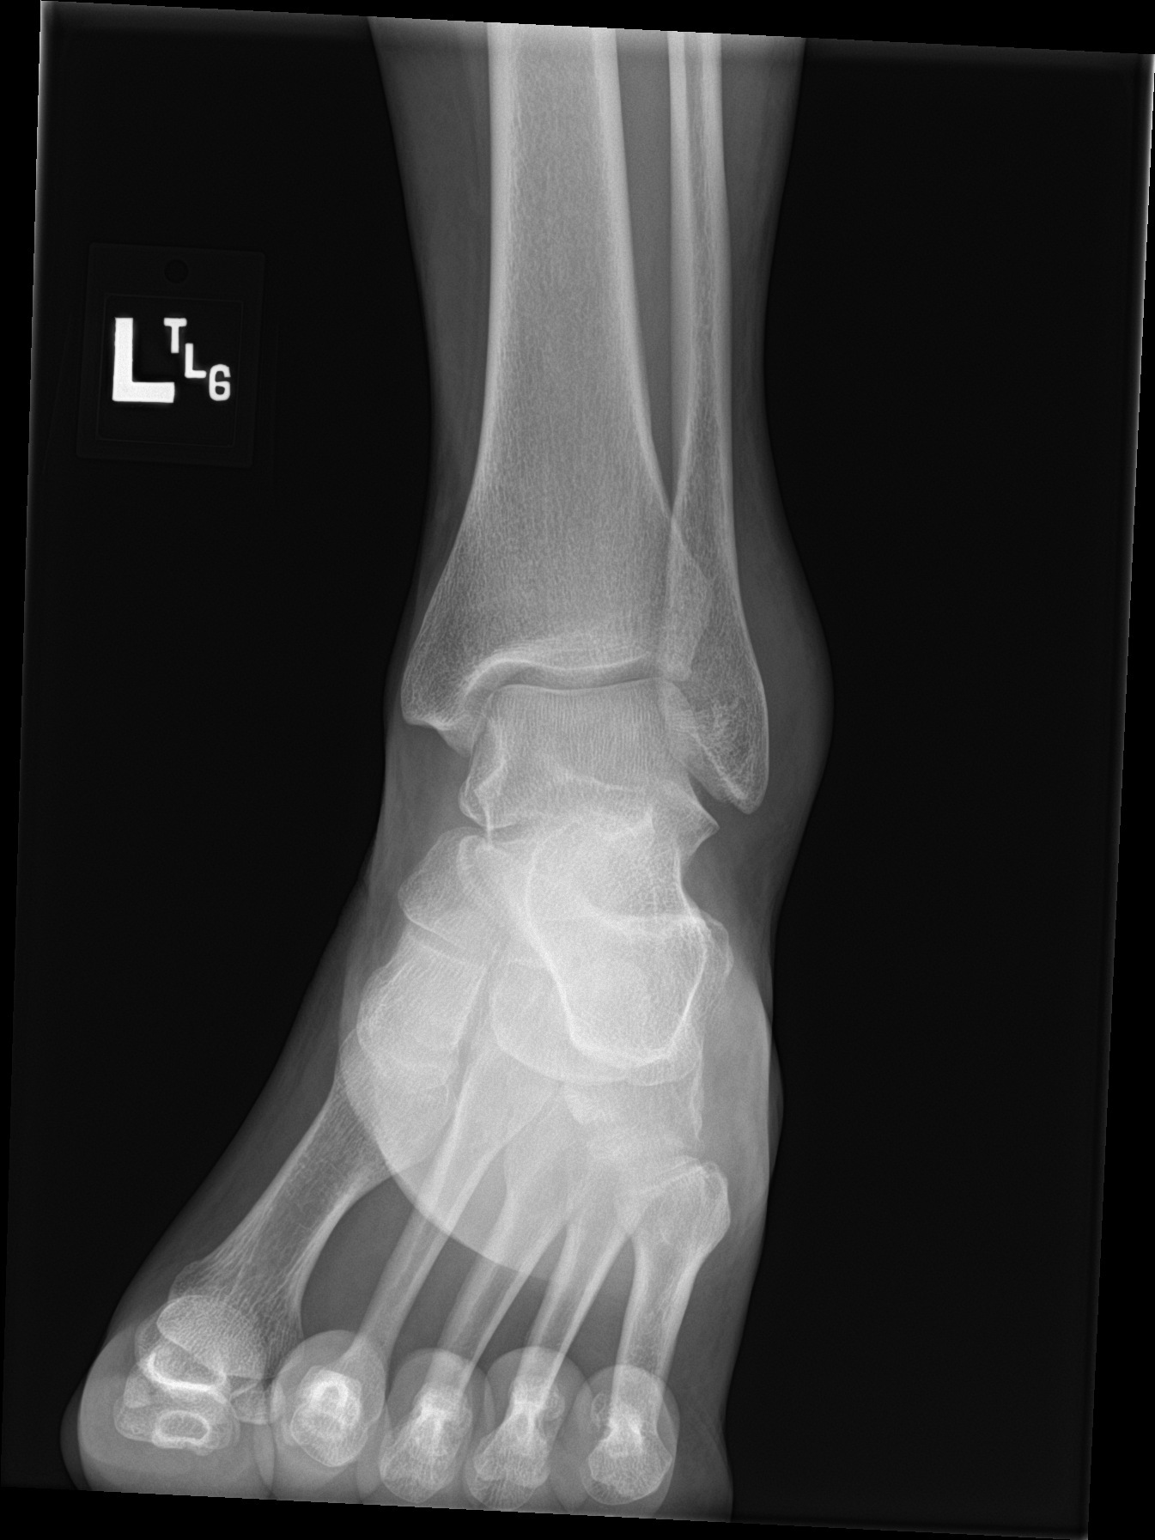

[ankle obl]
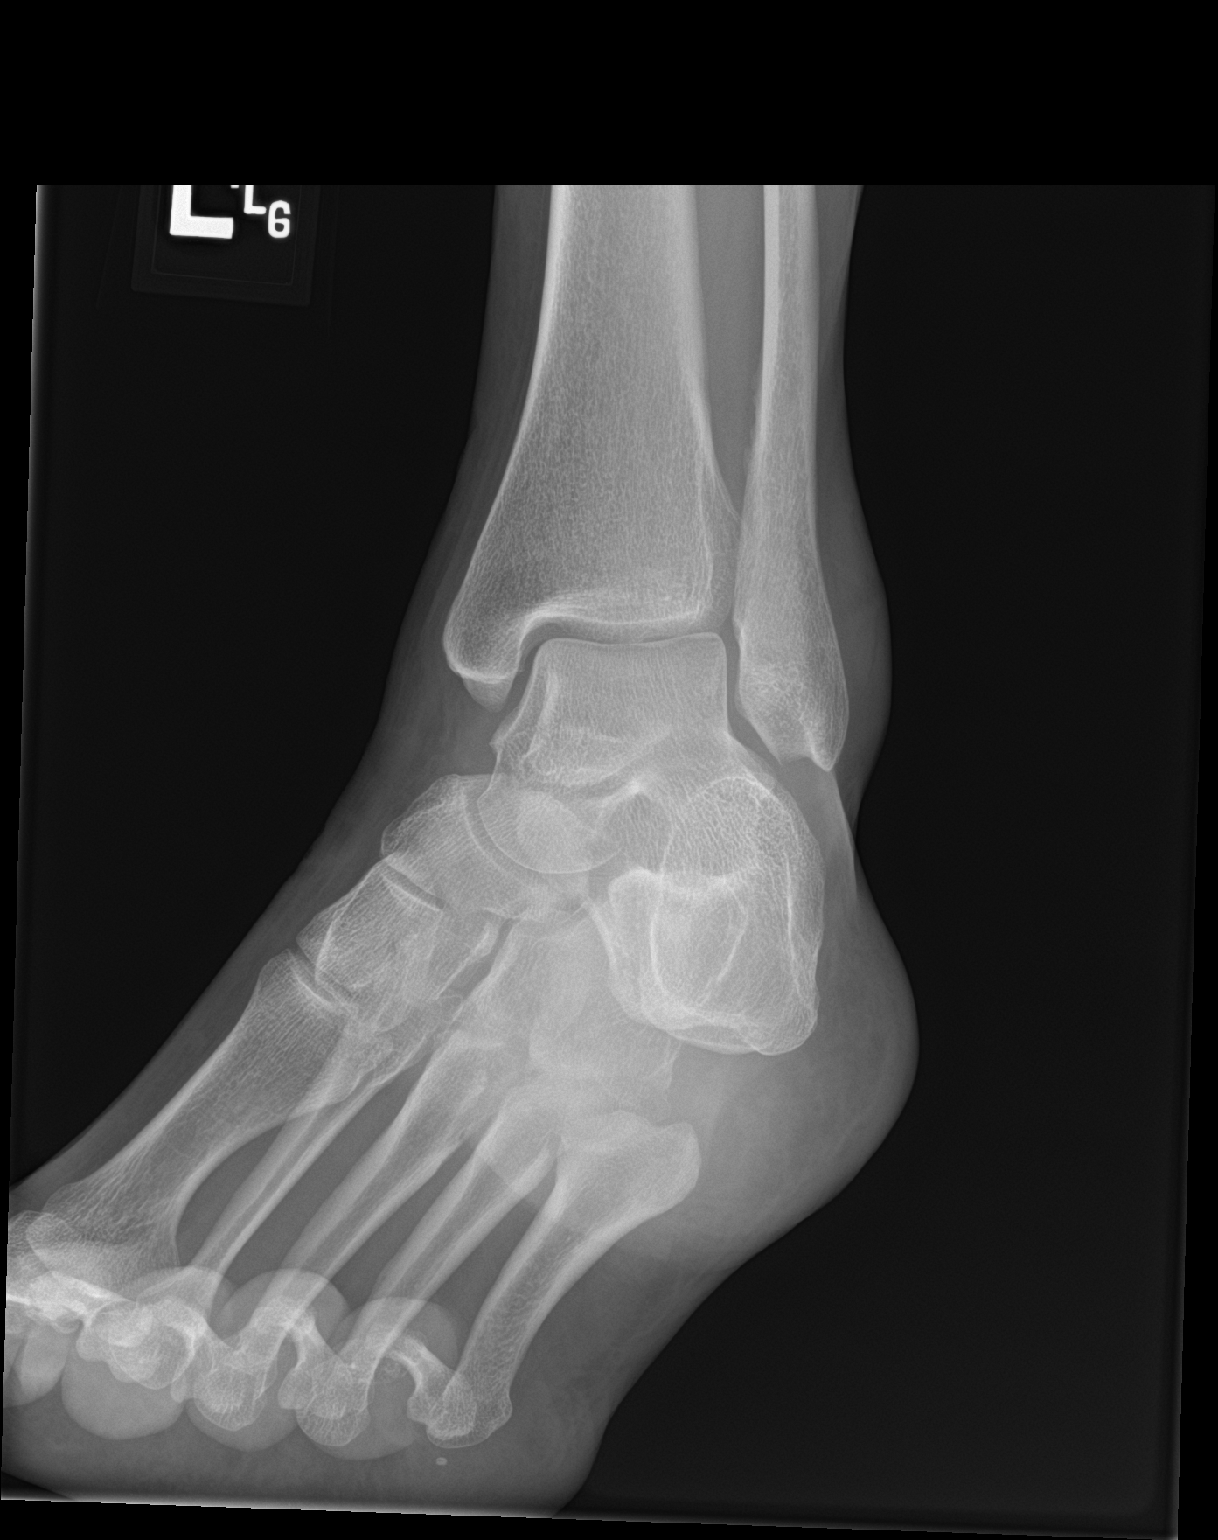

[ankle lat]
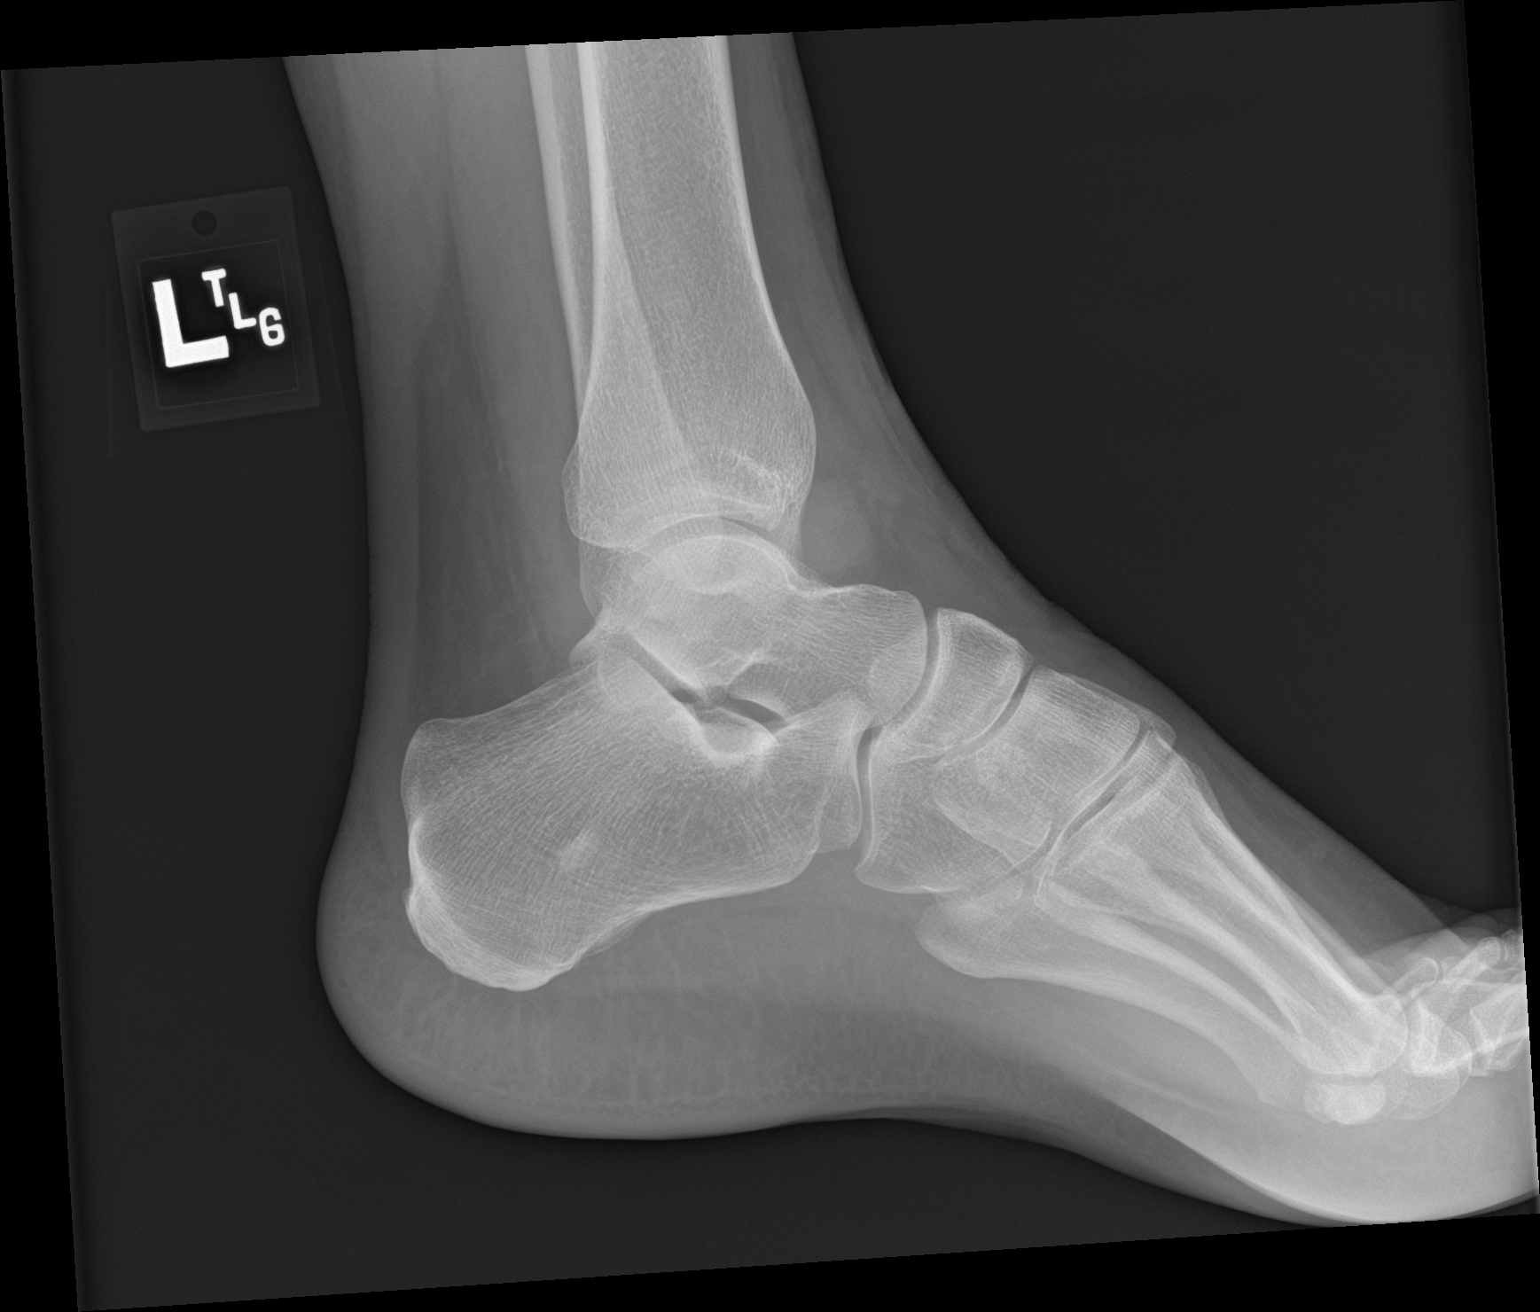

[3 of 3 positions shown; findings below may reference images not displayed]

FINDINGS: Frontal, oblique, and lateral views were obtained. There is rather
marked soft tissue swelling laterally. There is an ankle joint
effusion. There is no evident fracture. The ankle mortise appears
intact. There is Adalberto Rampersad. There is a bone island in the calcaneus.
IMPRESSION: Soft tissue swelling laterally with joint effusion. Suspect
ligamentous injury. No fracture evident. Ankle mortise appears
intact.
